# Patient Record
Sex: Male | Born: 1985 | Race: White | Hispanic: No | Marital: Married | Smoking: Never smoker
Health system: Southern US, Community
[De-identification: ages and names within clinical notes are randomized; demographics above are authoritative.]

---

## 2020-08-30 ENCOUNTER — Other Ambulatory Visit: Payer: Self-pay | Admitting: Physician Assistant

## 2020-08-30 DIAGNOSIS — R1032 Left lower quadrant pain: Secondary | ICD-10-CM

## 2020-08-30 DIAGNOSIS — R1031 Right lower quadrant pain: Secondary | ICD-10-CM

## 2020-09-12 ENCOUNTER — Ambulatory Visit
Admission: RE | Admit: 2020-09-12 | Discharge: 2020-09-12 | Disposition: A | Discharge status: Home / Self Care | Payer: 59 | Source: Ambulatory Visit | Attending: Physician Assistant | Admitting: Physician Assistant

## 2020-09-12 DIAGNOSIS — R1031 Right lower quadrant pain: Secondary | ICD-10-CM

## 2020-09-12 DIAGNOSIS — R1032 Left lower quadrant pain: Secondary | ICD-10-CM

## 2020-09-12 MED ORDER — IOPAMIDOL (ISOVUE-300) INJECTION 61%
100.0000 mL | Freq: Once | INTRAVENOUS | Status: AC | PRN
  Administered 2020-09-12: 100 mL via INTRAVENOUS

## 2020-09-21 ENCOUNTER — Other Ambulatory Visit: Payer: Self-pay | Admitting: Physician Assistant

## 2020-09-21 DIAGNOSIS — R1032 Left lower quadrant pain: Secondary | ICD-10-CM

## 2020-09-21 DIAGNOSIS — R1031 Right lower quadrant pain: Secondary | ICD-10-CM

## 2020-09-25 ENCOUNTER — Encounter (HOSPITAL_COMMUNITY): Payer: Self-pay | Admitting: Emergency Medicine

## 2020-09-25 ENCOUNTER — Emergency Department (HOSPITAL_COMMUNITY)
Admission: EM | Admit: 2020-09-25 | Discharge: 2020-09-25 | Disposition: A | Discharge status: Home / Self Care | Payer: 59 | Attending: Emergency Medicine | Admitting: Emergency Medicine

## 2020-09-25 ENCOUNTER — Other Ambulatory Visit: Payer: Self-pay

## 2020-09-25 DIAGNOSIS — M5442 Lumbago with sciatica, left side: Secondary | ICD-10-CM | POA: Insufficient documentation

## 2020-09-25 DIAGNOSIS — M545 Low back pain, unspecified: Secondary | ICD-10-CM | POA: Diagnosis present

## 2020-09-25 MED ORDER — METHYLPREDNISOLONE 4 MG PO TBPK: ORAL_TABLET | ORAL | 0 refills | Status: AC

## 2020-09-25 MED ORDER — OXYCODONE HCL 5 MG PO TABS: 5.0000 mg | ORAL_TABLET | Freq: Four times a day (QID) | ORAL | 0 refills | Status: AC | PRN

## 2020-09-25 NOTE — ED Triage Notes (Signed)
Pt. Stated, Phillip Molina had lower back pain for 4 months.

## 2020-09-25 NOTE — Discharge Instructions (Addendum)
You were seen in the emergency department for left low back pain  Exam and symptoms are most consistent with nerve inflammation, possibly of your sciatic nerve or piriformis syndrome  We discussed an emergent MRI is not necessary at this time, please follow-up with your scheduled MRI on November 9  Take 600 mg of ibuprofen and 1000 mg of acetaminophen every 6 hours for the next 5 days.  Take methylprednisone Dosepak, this can help nerve inflammation.  I have given you 5 mg of oxycodone that you can take for breakthrough or more severe pain, only as needed.  Refills of this medicine will not be done here in the ED, please follow-up with your primary care doctor if the above treatment regimen has not improved your pain, you may need referral to a specialist  Return to the ED for fever, burning with urination, inability to void or control your bladder or bowels, numbness in your genital area, weakness or paralysis in your extremities

## 2020-09-25 NOTE — ED Provider Notes (Signed)
[facility] EMERGENCY DEPARTMENT Provider Note   CSN: [id number] Arrival date & time: 09/25/20  2423     History Chief Complaint  Patient presents with  . Back Pain    Phillip Molina is a 34 y.o. male presents to the ED for evaluation of back pain for the last 4 months.  Pain is localized in the left lower back, buttock.  States initially it radiated into his groin but now it only radiates to the buttocks and posterior left proximal leg.  The pain is worse with walking, movement, palpation.  Has intermittent numbness in the left posterior leg and buttock that radiates to the posterior thigh.  He thinks he may have had a bad back pop a long time ago or may be pulled a muscle when he was moving a few months ago but is unsure.  No falls, recent exercise changes.  No saddle anesthesia, issues with bladder or bowel control, extremity weakness or numbness.  States at first he thought his urine and stool appearance and odor were off so he went to his PCP for some testing.  Over the course of the last few months he has seen his primary care doctor for this who has done multiple tests including urinalysis, stool testing, x-rays, CT scans of abdomen and pelvis last week.  He was told that everything was okay.  States his CT scan showed some possible abnormalities in his pelvis and they recommended an MRI.  His PCP has scheduled lumbar MRI for 11/9.  Patient states he felt like he could not wait that long and was hoping that he could have the MRI done here in the emergency department.  He really wants to find out what is going on so he can find a treatment for it.  Over the last few months he has tried ibuprofen, Tylenol, naproxen, Flexeril.  These medicines only provide mild and temporary relief of the pain.  He took his wife's leftover ?  Hydrocodone that she had and this did help with the pain but he felt uncomfortable taking it.  Feels like his pain is sciatica because some family members  have had it.  No fevers, respiratory symptoms, nausea, vomiting, new changes in urine or bowel movements.     HPI     No past medical history on file.  There are no problems to display for this patient.   ** The histories are not reviewed yet. Please review them in the "History" navigator section and refresh this SmartLink.     No family history on file.  Social History   Tobacco Use  . Smoking status: Never Smoker  . Smokeless tobacco: Never Used  Substance Use Topics  . Alcohol use: Yes  . Drug use: Not Currently    Home Medications Prior to Admission medications   Medication Sig Start Date End Date Taking? Authorizing Provider  methylPREDNISolone (MEDROL DOSEPAK) 4 MG TBPK tablet Take pack as prescribed and until completed 09/25/20   Liberty Handy, PA-C  oxyCODONE (OXY IR/ROXICODONE) 5 MG immediate release tablet Take 1 tablet (5 mg total) by mouth every 6 (six) hours as needed for up to 3 days for severe pain. 09/25/20 09/28/20  Liberty Handy, PA-C    Allergies    Patient has no allergy information on record.  Review of Systems   Review of Systems  Musculoskeletal: Positive for back pain and gait problem.  Neurological: Positive for numbness.  All other systems reviewed and are negative.  Physical Exam Updated Vital Signs BP (!) 137/95 (BP Location: Right Arm)   Pulse (!) 59   Temp 98.4 F (36.9 C) (Oral)   Resp 15   Ht 6\' 1"  (1.854 m)   Wt 87.1 kg   SpO2 99%   BMI 25.33 kg/m   Physical Exam Constitutional:      General: He is not in acute distress.    Appearance: He is well-developed.  HENT:     Head: Normocephalic and atraumatic.     Nose: Nose normal.  Cardiovascular:     Rate and Rhythm: Normal rate.     Pulses:          Dorsalis pedis pulses are 2+ on the right side and 2+ on the left side.     Heart sounds: Normal heart sounds.  Pulmonary:     Effort: Pulmonary effort is normal.     Breath sounds: Normal breath sounds.    Abdominal:     Palpations: Abdomen is soft.     Tenderness: There is no abdominal tenderness.     Comments: No suprapubic or CVA tenderness   Musculoskeletal:        General: Tenderness present.     Lumbar back: Tenderness present.     Comments: T-spine: no midline or paraspinal tenderness L-spine:  Left SI joint, upper buttock and sciatic notch tenderness.  No midline tenderness.  Positive left SLR. Pain with left hip IR.  Pelvis: no pain or crepitus with ER/downward pressure of hips bilaterally. No AP/L instability noted with compression. No leg shortening or rotation.    Skin:    General: Skin is warm and dry.     Capillary Refill: Capillary refill takes less than 2 seconds.     Comments: No overlaying rash to back   Neurological:     Mental Status: He is alert.     Sensory: No sensory deficit.     Comments: Can lift and hold legs without unilateral weakness or drift 5/5 strength with flexion/extension of hip, knee and ankle, bilaterally.  Sensation to light touch intact in lower extremities including feet  Psychiatric:        Behavior: Behavior normal.        Thought Content: Thought content normal.     ED Results / Procedures / Treatments   Labs (all labs ordered are listed, but only abnormal results are displayed) Labs Reviewed - No data to display  EKG None  Radiology No results found.  Procedures Procedures (including critical care time)  Medications Ordered in ED Medications - No data to display  ED Course  I have reviewed the triage vital signs and the nursing notes.  Pertinent labs & imaging results that were available during my care of the patient were reviewed by me and considered in my medical decision making (see chart for details).  Clinical Course as of Sep 26 1103  06-11-2003 Sep 25, 2020  1018 Has taken tylenol and ibuprofen but only mild relief  Naproxen Flexeril    [CG]  1101 CTAP on 10/11 reviewed   Musculoskeletal: Lucent areas are noted  within the left bony pelvis, particularly the left ischium and inferior pubic ramus. Rounded small lucencies also seen within the iliac bones bilaterally and right sacrum. These have a benign appearance and may reflect fibrous dysplasia or hemangiomas.  IMPRESSION: No acute findings in the abdomen or pelvis.  Lucent areas within the bony pelvis as described above. Favor these represent a benign process such as  fibrous dysplasia or hemangiomas. These could be further evaluated with MRI if felt clinically indicated    [CG]    Clinical Course User Index [CG] Liberty Handy, PA-C   MDM Rules/Calculators/A&P                          34 year old male otherwise healthy presents for nontraumatic left-sided low back pain that radiates into the left buttock, left posterior leg with intermittent numbness.  Followed by PCP who is ordered reportedly several tests including urinalysis, stool testing, x-rays, CT scans.  EMR, triage nursing notes reviewed to obtain more history and assist with MDM.  CT A/P done on 10/11 as above.  Patient has a scheduled MRI on 11/9.  MSK shows diffuse lumbar, SI joint tenderness.  Strength is intact.  Sensation intact. Abdominal exam benign, without pulsatility, suprapubic or CVA tenderness. Distal pulses symmetric bilaterally. No overlaying rash.   Highest on ddx is muscular strain vs spasm vs radiculopathy sciatica vs piriformis syndrome.  No red flag features of back pain present such as saddle anesthesia, bladder/bowel incontinence or retention, fevers, h/o cancer, IVDU, preceding trauma or falls, neuro deficits, urinary symptoms.    Considered other causes of back pain like UTI/pyelo, kidney stone, cauda equina, epidural abscess, dissection unlikely as these don't fit clinical picture.   Emergent imaging not thought to be indicated today as physical exam reassuring. Patient may benefit from MRI as OP. I see no indication for emergent MRI.  Explained this to  patient who was in agreement.   Will recommend muscle relaxer, high dose NSAIDs, medrol pack, voltaren gel, early ROM exercises.  Reviewed narcotic database, will give 3 day course for refractory pain.  Recommended f/u with PCP in 2-3 days for persistent symptoms.  ED return precautions discussed with patient who verbalized understanding and is agreeable to plan.   Final Clinical Impression(s) / ED Diagnoses Final diagnoses:  Acute left-sided low back pain with left-sided sciatica    Rx / DC Orders ED Discharge Orders         Ordered    methylPREDNISolone (MEDROL DOSEPAK) 4 MG TBPK tablet        09/25/20 1052    oxyCODONE (OXY IR/ROXICODONE) 5 MG immediate release tablet  Every 6 hours PRN        09/25/20 1052           Liberty Handy, [place] 09/25/20 1104    Maia Plan, MD 09/26/20 1351

## 2020-09-25 NOTE — ED Notes (Signed)
Pt verbalized understanding of discharge paperwork, follow-up and prescriptions. °

## 2020-10-11 ENCOUNTER — Ambulatory Visit
Admission: RE | Admit: 2020-10-11 | Discharge: 2020-10-11 | Disposition: A | Discharge status: Home / Self Care | Payer: 59 | Source: Ambulatory Visit | Attending: Physician Assistant | Admitting: Physician Assistant

## 2020-10-11 DIAGNOSIS — R1032 Left lower quadrant pain: Secondary | ICD-10-CM

## 2020-10-11 DIAGNOSIS — R1031 Right lower quadrant pain: Secondary | ICD-10-CM

## 2020-10-11 MED ORDER — GADOBENATE DIMEGLUMINE 529 MG/ML IV SOLN
18.0000 mL | Freq: Once | INTRAVENOUS | Status: AC | PRN
  Administered 2020-10-11: 18 mL via INTRAVENOUS

## 2021-05-30 IMAGING — MR MR PELVIS WO/W CM
9 series · 46 of 48 positions shown · IV contrast (multihance)
Comparison: CT abdomen and pelvis 09/12/2020.

CLINICAL DATA: Left pelvic pain for approximately 2 months.
Possible bone lesions on prior CT abdomen and pelvis.

EXAM:
MRI PELVIS WITHOUT AND WITH CONTRAST
TECHNIQUE: Multiplanar multisequence MR imaging of the pelvis was performed
both before and after administration of intravenous contrast.
CONTRAST:  18 mL MULTIHANCE GADOBENATE DIMEGLUMINE 529 MG/ML IV SOLN

[Series 3: T2 fat-sat · sagittal · 4.0mm · 0.47mm/px · 6 of 70 slices shown (1 of 2)]
[im 1/70]
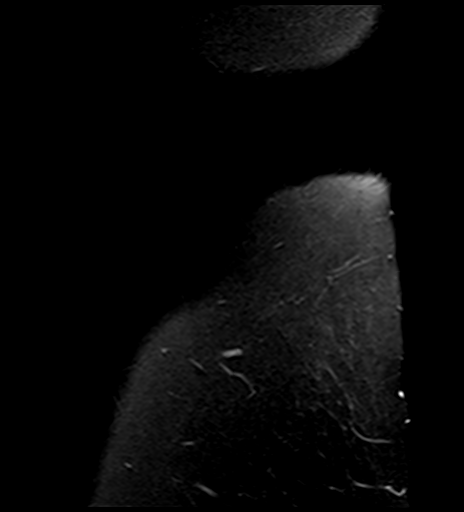
[im 14/70]
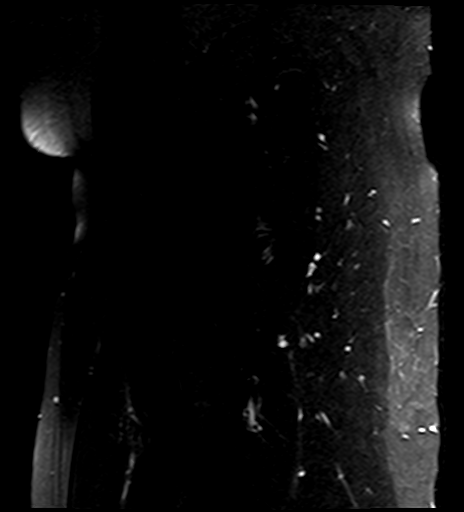
[im 28/70]
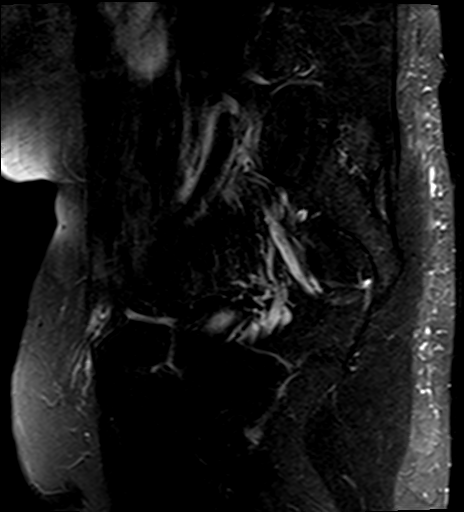
[im 42/70]
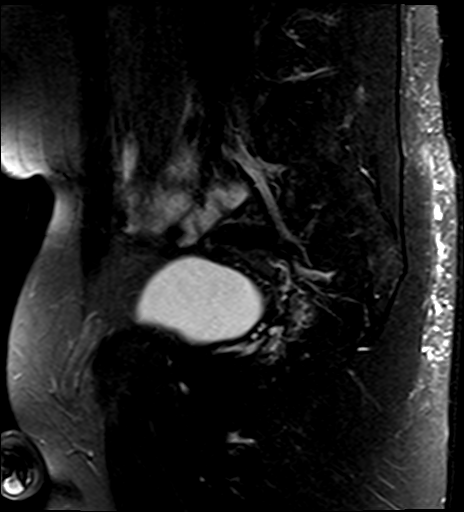
[im 56/70]
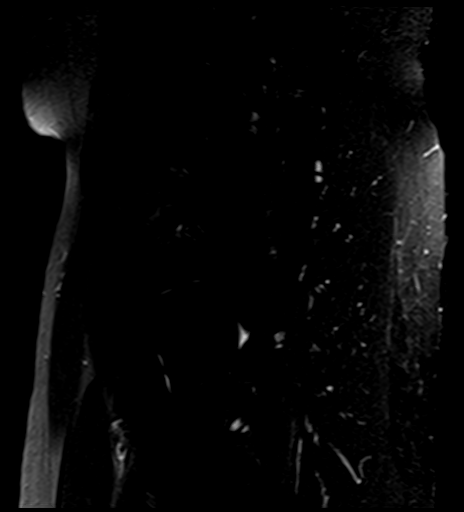
[im 70/70]
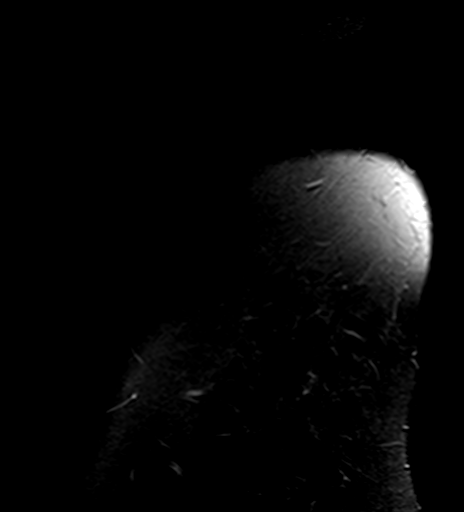

[Series 4: T1 · coronal · 4.0mm · 1.56mm/px · 3 of 39 slices shown (1 of 2)]
[im 1/39]
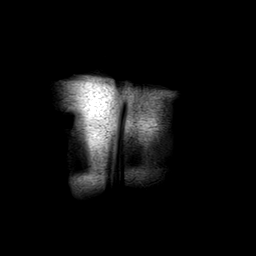
[im 20/39]
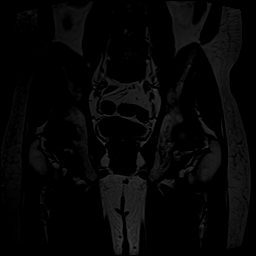
[im 39/39]
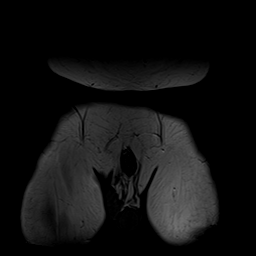

[Series 5: STIR · coronal · 4.0mm · 1.56mm/px · 4 of 39 slices shown]
[im 1/39]
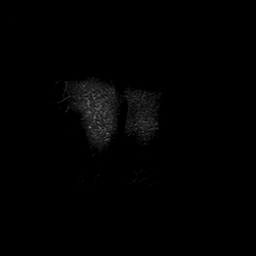
[im 13/39]
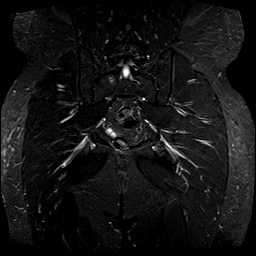
[im 26/39]
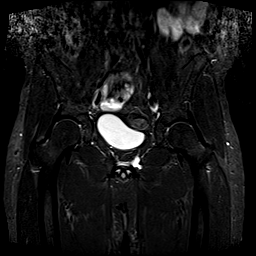
[im 39/39]
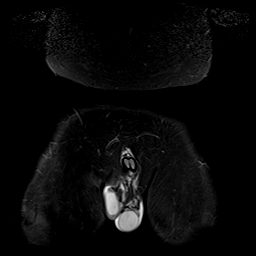

[Series 6: T1 · axial · 4.0mm · 0.68mm/px · z∈[-82,+188]mm · 6 of 55 slices shown (2 of 2)]
[im 1/55]
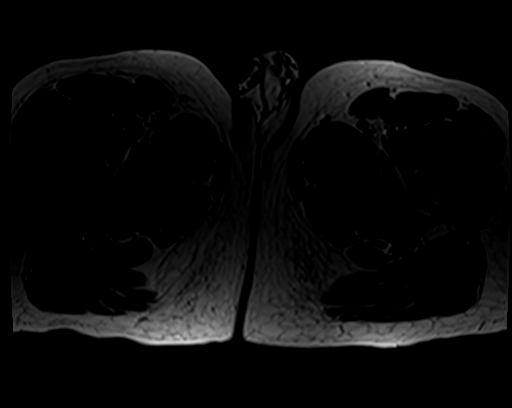
[im 11/55]
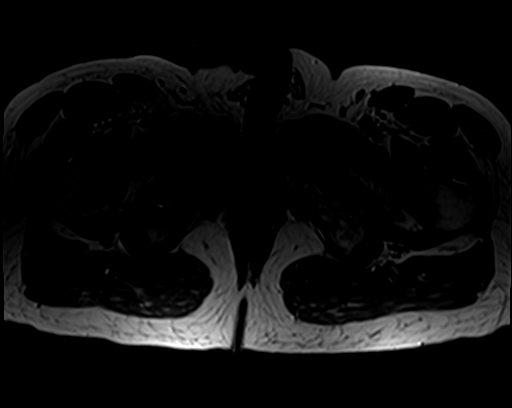
[im 22/55]
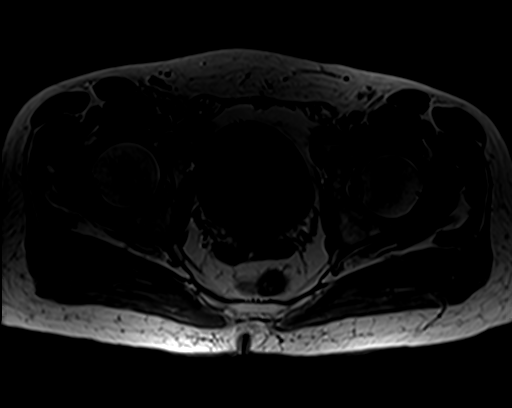
[im 33/55]
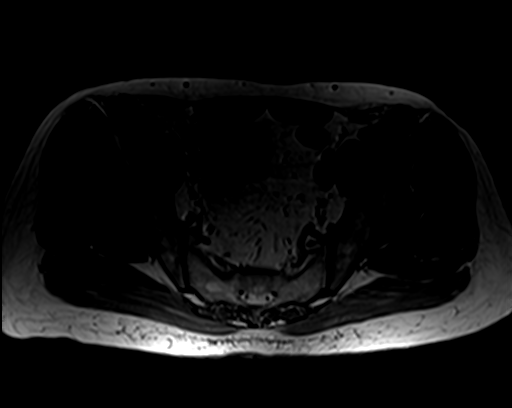
[im 44/55]
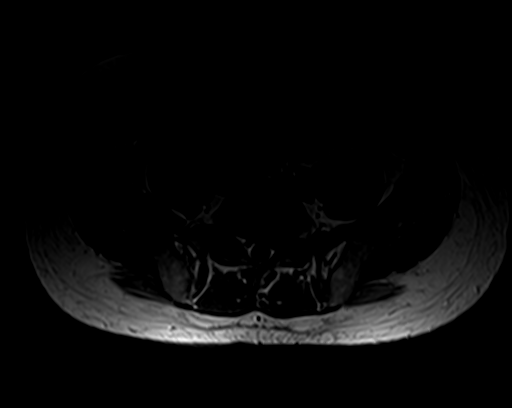
[im 55/55]
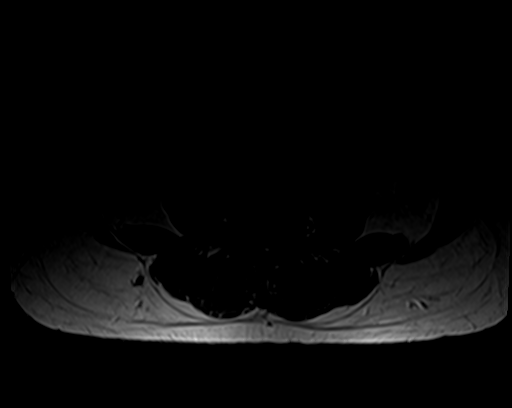

[Series 7: T2 fat-sat · axial · 4.0mm · 1.37mm/px · z∈[-82,+188]mm · 6 of 55 slices shown (2 of 2)]
[im 1/55]
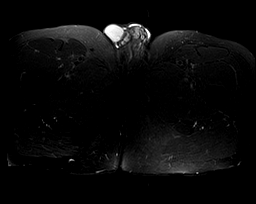
[im 11/55]
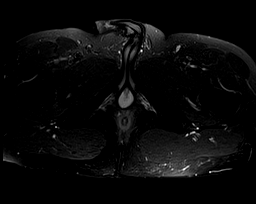
[im 22/55]
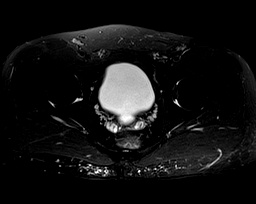
[im 33/55]
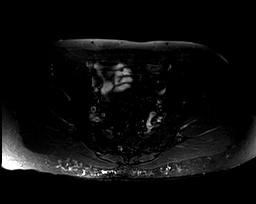
[im 44/55]
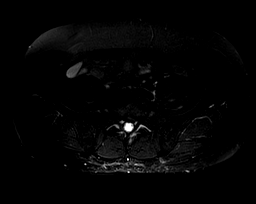
[im 55/55]
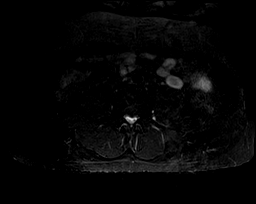

[Series 8: T1 fat-sat · axial · 4.0mm · 1.37mm/px · z∈[-82,+188]mm · 6 of 55 slices shown (1 of 4)]
[im 1/55]
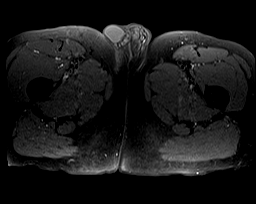
[im 11/55]
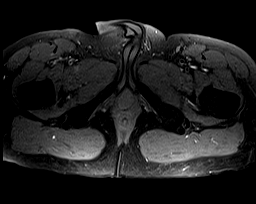
[im 22/55]
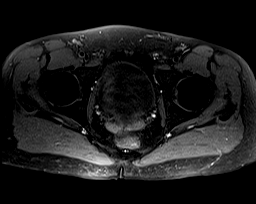
[im 33/55]
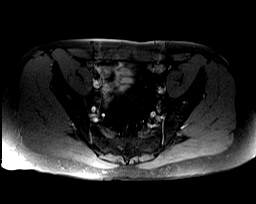
[im 44/55]
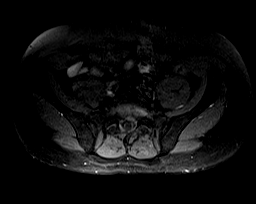
[im 55/55]
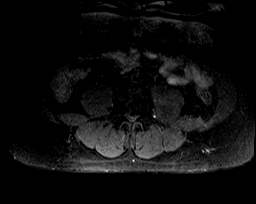

[Series 9: T1 fat-sat · axial · 4.0mm · 1.37mm/px · z∈[-82,+188]mm · 6 of 55 slices shown (2 of 4)]
[im 1/55]
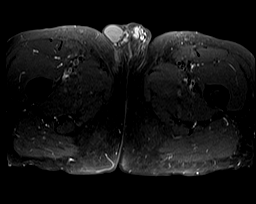
[im 11/55]
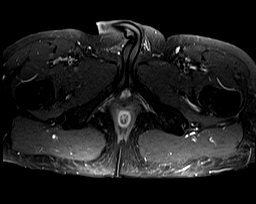
[im 22/55]
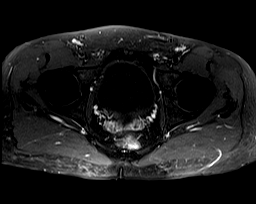
[im 33/55]
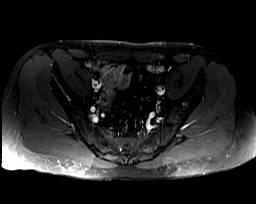
[im 44/55]
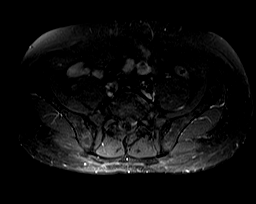
[im 55/55]
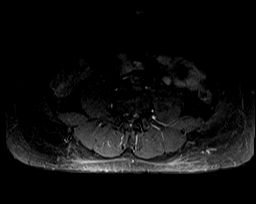

[Series 10: T1 fat-sat · coronal · 4.0mm · 1.56mm/px · 4 of 39 slices shown (3 of 4)]
[im 1/39]
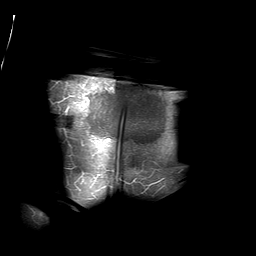
[im 13/39]
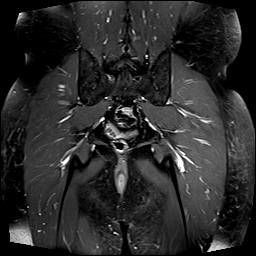
[im 26/39]
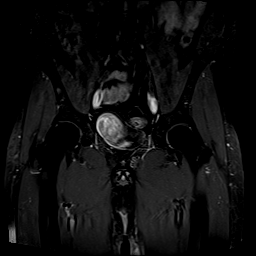
[im 39/39]
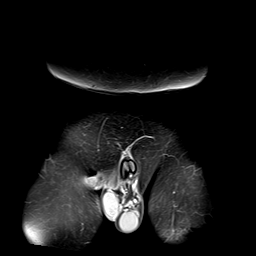

[Series 11: T1 fat-sat · sagittal · 4.0mm · 0.94mm/px · 5 of 70 slices shown (4 of 4)]
[im 1/70]
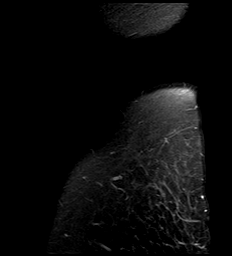
[im 12/70]
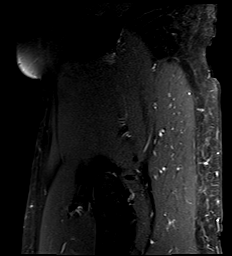
[im 24/70]
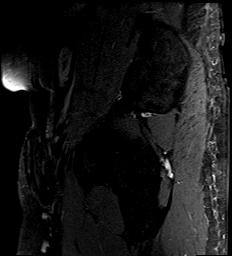
[im 35/70]
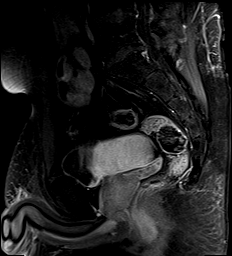
[im 47/70]
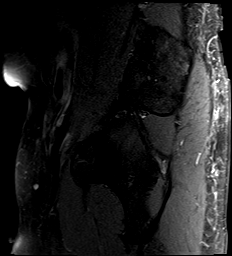

[46 of 48 positions shown; findings below may reference images not displayed]

FINDINGS: Bones/Joint/Cartilage

No fracture, stress change or worrisome lesion is identified. There
is no abnormal enhancement of bone after contrast administration.
Note is made that the patient has a shallow central protrusion at
L5-S1 although the central canal appears open.

Ligaments

Normal.

Muscles and Tendons

Normal.

Soft tissues

Normal.
IMPRESSION: Normal examination. Lucencies on prior CT are most consistent with
areas of focal fatty deposition in bone marrow. No follow-up imaging
is indicated.

Disc bulge L5-S1.  The central canal appears open.
